# Patient Record
Sex: Female | Born: 1965 | Race: White | Hispanic: No | Marital: Married | Smoking: Former smoker
Health system: Southern US, Community
[De-identification: ages and names within clinical notes are randomized; demographics above are authoritative.]

## PROBLEM LIST (undated history)

## (undated) DIAGNOSIS — K219 Gastro-esophageal reflux disease without esophagitis: Secondary | ICD-10-CM

## (undated) DIAGNOSIS — D219 Benign neoplasm of connective and other soft tissue, unspecified: Secondary | ICD-10-CM

## (undated) DIAGNOSIS — F32A Depression, unspecified: Secondary | ICD-10-CM

## (undated) DIAGNOSIS — F329 Major depressive disorder, single episode, unspecified: Secondary | ICD-10-CM

## (undated) DIAGNOSIS — Z8619 Personal history of other infectious and parasitic diseases: Secondary | ICD-10-CM

## (undated) DIAGNOSIS — E569 Vitamin deficiency, unspecified: Secondary | ICD-10-CM

## (undated) DIAGNOSIS — N840 Polyp of corpus uteri: Secondary | ICD-10-CM

## (undated) DIAGNOSIS — D649 Anemia, unspecified: Secondary | ICD-10-CM

## (undated) DIAGNOSIS — G510 Bell's palsy: Secondary | ICD-10-CM

## (undated) HISTORY — DX: Polyp of corpus uteri: N84.0

## (undated) HISTORY — PX: ENDOMETRIAL BIOPSY: SHX622

## (undated) HISTORY — DX: Gastro-esophageal reflux disease without esophagitis: K21.9

## (undated) HISTORY — DX: Benign neoplasm of connective and other soft tissue, unspecified: D21.9

## (undated) HISTORY — DX: Vitamin deficiency, unspecified: E56.9

---

## 1988-02-14 HISTORY — PX: DILATION AND CURETTAGE OF UTERUS: SHX78

## 2004-06-29 ENCOUNTER — Ambulatory Visit: Payer: Self-pay | Admitting: Unknown Physician Specialty

## 2004-07-14 ENCOUNTER — Ambulatory Visit: Payer: Self-pay | Admitting: Unknown Physician Specialty

## 2006-04-30 ENCOUNTER — Ambulatory Visit: Payer: Self-pay | Admitting: Family Medicine

## 2006-09-10 IMAGING — US ULTRASOUND LEFT BREAST
1 series · 6 of 6 positions shown · non-contrast
Comparison: none

REASON FOR EXAM: lt breast mass      fibrocystic disease     time for yearly
COMMENTS:

PROCEDURE:     US  - US BREAST LEFT  - June 29, 2004 [DATE]
RESULT:       LEFT breast ultrasound was performed from 6 o'clock to 7
o'clock in the region of palpable abnormality.   No abnormalities were
identified by ultrasound.  This does not preclude biopsy if a palpable
nodule is present.

[Series 1: ultrasound left breast · 6 of 6 slices shown]
[im 1/6]
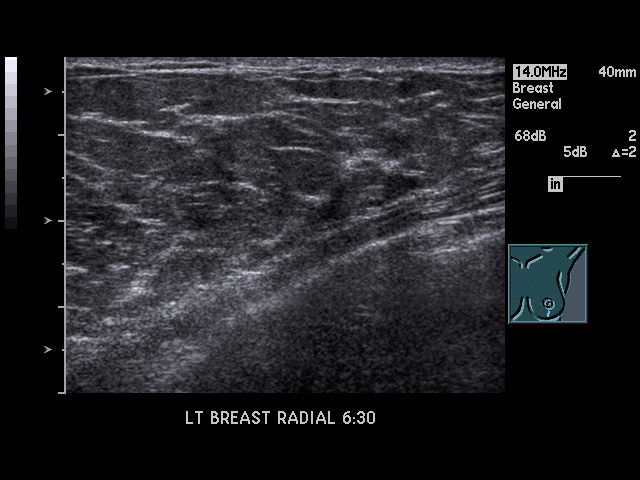
[im 2/6]
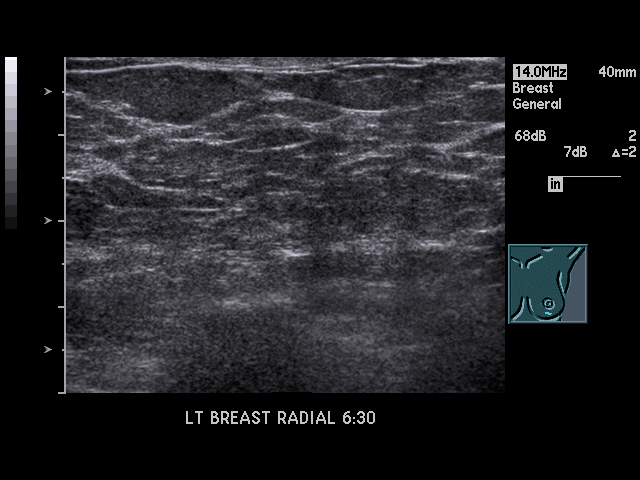
[im 3/6]
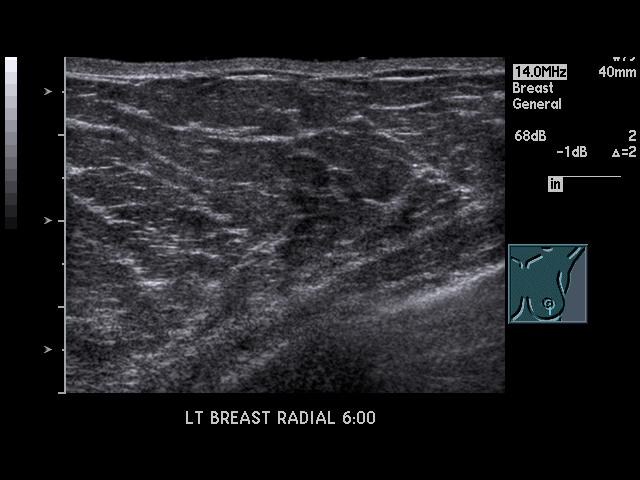
[im 4/6]
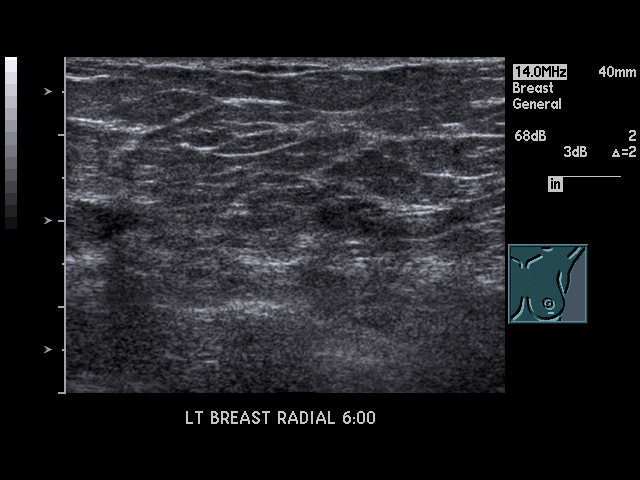
[im 5/6]
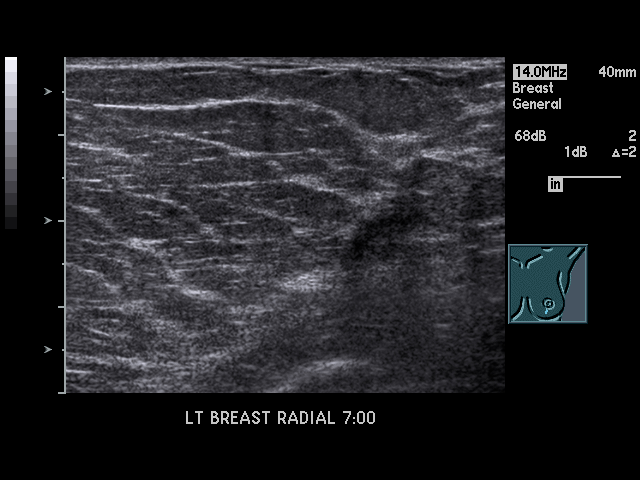
[im 6/6]
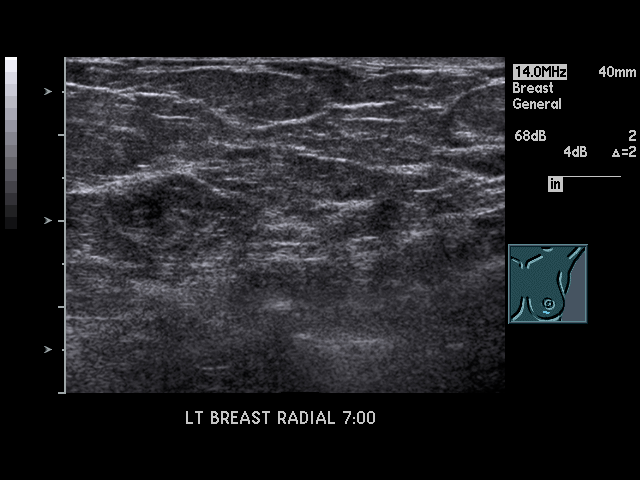

[6 of 6 positions shown; findings below may reference images not displayed]

IMPRESSION: Ultrasound revealed no abnormalities.  This does not
preclude biopsy if a palpable lesion is present.

## 2007-10-23 ENCOUNTER — Ambulatory Visit: Payer: Self-pay | Admitting: Family Medicine

## 2008-09-21 ENCOUNTER — Ambulatory Visit: Payer: Self-pay | Admitting: Internal Medicine

## 2009-04-07 ENCOUNTER — Ambulatory Visit: Payer: Self-pay

## 2009-04-30 ENCOUNTER — Ambulatory Visit: Payer: Self-pay | Admitting: Unknown Physician Specialty

## 2011-11-07 LAB — HM MAMMOGRAPHY: HM Mammogram: NORMAL (ref 0–4)

## 2014-05-09 LAB — HM PAP SMEAR: HM Pap smear: NEGATIVE

## 2014-12-24 ENCOUNTER — Encounter: Payer: Self-pay | Admitting: *Deleted

## 2014-12-25 ENCOUNTER — Encounter: Admission: RE | Payer: Self-pay | Source: Ambulatory Visit

## 2014-12-25 ENCOUNTER — Ambulatory Visit: Admission: RE | Admit: 2014-12-25 | Payer: Self-pay | Source: Ambulatory Visit | Admitting: Gastroenterology

## 2014-12-25 HISTORY — DX: Depression, unspecified: F32.A

## 2014-12-25 HISTORY — DX: Major depressive disorder, single episode, unspecified: F32.9

## 2014-12-25 HISTORY — DX: Personal history of other infectious and parasitic diseases: Z86.19

## 2014-12-25 HISTORY — DX: Anemia, unspecified: D64.9

## 2014-12-25 HISTORY — DX: Bell's palsy: G51.0

## 2014-12-25 SURGERY — COLONOSCOPY WITH PROPOFOL
Anesthesia: General

## 2015-01-13 ENCOUNTER — Encounter: Payer: Self-pay | Admitting: *Deleted

## 2015-01-14 ENCOUNTER — Ambulatory Visit: Payer: 59 | Admitting: Anesthesiology

## 2015-01-14 ENCOUNTER — Encounter
Admission: RE | Disposition: A | Discharge status: Home / Self Care | Payer: Self-pay | Source: Ambulatory Visit | Attending: Gastroenterology

## 2015-01-14 ENCOUNTER — Ambulatory Visit
Admission: RE | Admit: 2015-01-14 | Discharge: 2015-01-14 | Disposition: A | Discharge status: Home / Self Care | Payer: 59 | Source: Ambulatory Visit | Attending: Gastroenterology | Admitting: Gastroenterology

## 2015-01-14 DIAGNOSIS — Z79899 Other long term (current) drug therapy: Secondary | ICD-10-CM | POA: Diagnosis not present

## 2015-01-14 DIAGNOSIS — Z880 Allergy status to penicillin: Secondary | ICD-10-CM | POA: Diagnosis not present

## 2015-01-14 DIAGNOSIS — Z9109 Other allergy status, other than to drugs and biological substances: Secondary | ICD-10-CM | POA: Insufficient documentation

## 2015-01-14 DIAGNOSIS — Z1211 Encounter for screening for malignant neoplasm of colon: Secondary | ICD-10-CM | POA: Insufficient documentation

## 2015-01-14 DIAGNOSIS — D128 Benign neoplasm of rectum: Secondary | ICD-10-CM | POA: Diagnosis not present

## 2015-01-14 DIAGNOSIS — Z881 Allergy status to other antibiotic agents status: Secondary | ICD-10-CM | POA: Diagnosis not present

## 2015-01-14 DIAGNOSIS — F329 Major depressive disorder, single episode, unspecified: Secondary | ICD-10-CM | POA: Diagnosis not present

## 2015-01-14 HISTORY — PX: COLONOSCOPY WITH PROPOFOL: SHX5780

## 2015-01-14 HISTORY — PX: COLPOSCOPY: SHX161

## 2015-01-14 SURGERY — COLONOSCOPY WITH PROPOFOL
Anesthesia: General

## 2015-01-14 MED ORDER — SODIUM CHLORIDE 0.9 % IV SOLN
INTRAVENOUS | Status: DC
  Administered 2015-01-14: 10:00:00 via INTRAVENOUS

## 2015-01-14 MED ORDER — EPHEDRINE SULFATE 50 MG/ML IJ SOLN
INTRAMUSCULAR | Status: DC | PRN
  Administered 2015-01-14: 5 mg via INTRAVENOUS

## 2015-01-14 MED ORDER — PROPOFOL 500 MG/50ML IV EMUL
INTRAVENOUS | Status: DC | PRN
  Administered 2015-01-14: 120 ug/kg/min via INTRAVENOUS

## 2015-01-14 MED ORDER — MIDAZOLAM HCL 2 MG/2ML IJ SOLN
INTRAMUSCULAR | Status: DC | PRN
  Administered 2015-01-14: 1 mg via INTRAVENOUS

## 2015-01-14 MED ORDER — FENTANYL CITRATE (PF) 100 MCG/2ML IJ SOLN
INTRAMUSCULAR | Status: DC | PRN
  Administered 2015-01-14: 50 ug via INTRAVENOUS

## 2015-01-14 NOTE — Transfer of Care (Signed)
Immediate Anesthesia Transfer of Care Note  Patient: Sheila Powers  Procedure(s) Performed: Procedure(s): COLONOSCOPY WITH PROPOFOL (N/A)  Patient Location: PACU  Anesthesia Type:General  Level of Consciousness: awake, alert  and sedated  Airway & Oxygen Therapy: Patient Spontanous Breathing and Patient connected to nasal cannula oxygen  Post-op Assessment: Report given to RN and Post -op Vital signs reviewed and stable  Post vital signs: Reviewed and stable  Last Vitals:  Filed Vitals:   01/14/15 1002  BP: 126/76  Pulse: 72  Temp: 36.8 C  Resp: 17    Complications: No apparent anesthesia complications

## 2015-01-14 NOTE — H&P (Signed)
    Primary Care Physician:  Maryland Pink, MD Primary Gastroenterologist:  Dr. Candace Cruise  Pre-Procedure History & Physical: HPI:  Sheila Powers is a 49 y.o. female is here for an colonoscopy.  Past Medical History  Diagnosis Date  . Anemia   . Depression   . History of chicken pox   . Bell's palsy     Past Surgical History  Procedure Laterality Date  . Dilation and curettage of uterus  1990  . Breast biopsy  1987  1996    Prior to Admission medications   Medication Sig Start Date End Date Taking? Authorizing Provider  ergocalciferol (VITAMIN D2) 50000 UNITS capsule Take 50,000 Units by mouth once a week.    Historical Provider, MD  escitalopram (LEXAPRO) 20 MG tablet Take 20 mg by mouth daily.    Historical Provider, MD  omeprazole (PRILOSEC) 20 MG capsule Take 20 mg by mouth daily.    Historical Provider, MD    Allergies as of 01/01/2015 - Review Complete 12/24/2014  Allergen Reaction Noted  . Erythromycin  12/24/2014  . Penicillins  12/24/2014  . Wellbutrin [bupropion]  12/24/2014    History reviewed. No pertinent family history.  Social History   Social History  . Marital Status: Married    Spouse Name: N/A  . Number of Children: N/A  . Years of Education: N/A   Occupational History  . Not on file.   Social History Main Topics  . Smoking status: Former Smoker    Types: Cigarettes  . Smokeless tobacco: Former Systems developer  . Alcohol Use: No  . Drug Use: No  . Sexual Activity: Not on file   Other Topics Concern  . Not on file   Social History Narrative    Review of Systems: See HPI, otherwise negative ROS  Physical Exam: There were no vitals taken for this visit. General:   Alert,  pleasant and cooperative in NAD Head:  Normocephalic and atraumatic. Neck:  Supple; no masses or thyromegaly. Lungs:  Clear throughout to auscultation.    Heart:  Regular rate and rhythm. Abdomen:  Soft, nontender and nondistended. Normal bowel sounds, without guarding, and  without rebound.   Neurologic:  Alert and  oriented x4;  grossly normal neurologically.  Impression/Plan: AMIE TONKINSON is here for an colonoscopy to be performed for screening.  Risks, benefits, limitations, and alternatives regarding  colonoscopy have been reviewed with the patient.  Questions have been answered.  All parties agreeable.   Caillou Minus, Lupita Dawn, MD  01/14/2015, 9:46 AM

## 2015-01-14 NOTE — Anesthesia Procedure Notes (Signed)
Performed by: COOK-MARTIN, Nikyah Lackman Pre-anesthesia Checklist: Patient identified, Emergency Drugs available, Suction available, Patient being monitored and Timeout performed Patient Re-evaluated:Patient Re-evaluated prior to inductionOxygen Delivery Method: Nasal cannula Preoxygenation: Pre-oxygenation with 100% oxygen Intubation Type: IV induction Placement Confirmation: positive ETCO2 and CO2 detector       

## 2015-01-14 NOTE — Anesthesia Postprocedure Evaluation (Signed)
Anesthesia Post Note  Patient: Sheila Powers  Procedure(s) Performed: Procedure(s) (LRB): COLONOSCOPY WITH PROPOFOL (N/A)  Patient location during evaluation: PACU Anesthesia Type: General Level of consciousness: awake Pain management: pain level controlled Vital Signs Assessment: post-procedure vital signs reviewed and stable Respiratory status: spontaneous breathing Cardiovascular status: stable Anesthetic complications: no    Last Vitals:  Filed Vitals:   01/14/15 1140 01/14/15 1143  BP: 119/105 110/83  Pulse: 73   Temp:    Resp: 24     Last Pain: There were no vitals filed for this visit.               VAN STAVEREN,Perri Aragones

## 2015-01-14 NOTE — Anesthesia Preprocedure Evaluation (Signed)
Anesthesia Evaluation  Patient identified by MRN, date of birth, ID band Patient awake    Reviewed: Allergy & Precautions, NPO status , Patient's Chart, lab work & pertinent test results  History of Anesthesia Complications Negative for: history of anesthetic complications  Airway Mallampati: II       Dental no notable dental hx. (+) Teeth Intact   Pulmonary neg pulmonary ROS, former smoker,    breath sounds clear to auscultation       Cardiovascular negative cardio ROS   Rhythm:Regular     Neuro/Psych    GI/Hepatic negative GI ROS, Neg liver ROS,   Endo/Other  negative endocrine ROS  Renal/GU negative Renal ROS     Musculoskeletal negative musculoskeletal ROS (+)   Abdominal Normal abdominal exam  (+)   Peds  Hematology negative hematology ROS (+)   Anesthesia Other Findings   Reproductive/Obstetrics                             Anesthesia Physical Anesthesia Plan  ASA: II  Anesthesia Plan: General   Post-op Pain Management:    Induction: Intravenous  Airway Management Planned: Nasal Cannula  Additional Equipment:   Intra-op Plan:   Post-operative Plan:   Informed Consent: I have reviewed the patients History and Physical, chart, labs and discussed the procedure including the risks, benefits and alternatives for the proposed anesthesia with the patient or authorized representative who has indicated his/her understanding and acceptance.     Plan Discussed with: CRNA  Anesthesia Plan Comments:         Anesthesia Quick Evaluation

## 2015-01-14 NOTE — Op Note (Signed)
[facility] Gastroenterology Patient Name: Sheila Powers Procedure Date: 01/14/2015 10:46 AM MRN: [id number] Account #: [id number] Date of Birth: [birth year] Admit Type: Outpatient Age: 49 Room: [facility] ENDO ROOM 3 Gender: Female Note Status: Finalized Procedure:         Colonoscopy Indications:       Screening for colorectal malignant neoplasm Providers:         Lupita Dawn. Candace Cruise, MD Referring MD:      Irven Easterly. Kary Kos, MD (Referring MD) Medicines:         Monitored Anesthesia Care Complications:     No immediate complications. Procedure:         Pre-Anesthesia Assessment:                    - Prior to the procedure, a History and Physical was                     performed, and patient medications, allergies and                     sensitivities were reviewed. The patient's tolerance of                     previous anesthesia was reviewed.                    - The risks and benefits of the procedure and the sedation                     options and risks were discussed with the patient. All                     questions were answered and informed consent was obtained.                    - After reviewing the risks and benefits, the patient was                     deemed in satisfactory condition to undergo the procedure.                    After obtaining informed consent, the colonoscope was                     passed under direct vision. Throughout the procedure, the                     patient's blood pressure, pulse, and oxygen saturations                     were monitored continuously. The Olympus PCF-H180AL                     colonoscope ( S#: [id number] ) was introduced through the                     anus and advanced to the the cecum, identified by                     appendiceal orifice and ileocecal valve. The colonoscopy                     was performed without difficulty. The patient tolerated  the procedure well. The quality of the bowel  preparation                     was good. Findings:      A small polyp was found in the rectum. The polyp was sessile. The polyp       was removed with a cold snare. Resection and retrieval were complete.      The exam was otherwise without abnormality. Impression:        - One small polyp in the rectum. Resected and retrieved.                    - The examination was otherwise normal. Recommendation:    - Discharge patient to home.                    - Await pathology results.                    - The findings and recommendations were discussed with the                     patient.                    - Repeat colonoscopy in 5 years for surveillance based on                     pathology results.                    - The findings and recommendations were discussed with the                     patient. Procedure Code(s): --- Professional ---                    [phone], Colonoscopy, flexible; with removal of tumor(s),                     polyp(s), or other lesion(s) by snare technique Diagnosis Code(s): --- Professional ---                    Z12.11, Encounter for screening for malignant neoplasm of                     colon                    K62.1, Rectal polyp CPT copyright 2014 American Medical Association. All rights reserved. The codes documented in this report are preliminary and upon coder review may  be revised to meet current compliance requirements. Hulen Luster, MD 01/14/2015 11:06:56 AM This report has been signed electronically. Number of Addenda: 0 Note Initiated On: 01/14/2015 10:46 AM Scope Withdrawal Time: 0 hours 4 minutes 44 seconds  Total Procedure Duration: 0 hours 11 minutes 54 seconds       [facility]

## 2015-01-15 LAB — SURGICAL PATHOLOGY

## 2015-01-18 ENCOUNTER — Encounter: Payer: Self-pay | Admitting: Gastroenterology

## 2016-02-23 ENCOUNTER — Other Ambulatory Visit: Payer: Self-pay | Admitting: Family Medicine

## 2016-02-23 DIAGNOSIS — Z1231 Encounter for screening mammogram for malignant neoplasm of breast: Secondary | ICD-10-CM

## 2016-03-24 ENCOUNTER — Ambulatory Visit
Admission: RE | Admit: 2016-03-24 | Discharge: 2016-03-24 | Disposition: A | Discharge status: Home / Self Care | Payer: BLUE CROSS/BLUE SHIELD | Source: Ambulatory Visit | Attending: Family Medicine | Admitting: Family Medicine

## 2016-03-24 ENCOUNTER — Encounter (HOSPITAL_COMMUNITY): Payer: Self-pay

## 2016-03-24 DIAGNOSIS — Z1231 Encounter for screening mammogram for malignant neoplasm of breast: Secondary | ICD-10-CM | POA: Diagnosis not present

## 2016-05-23 ENCOUNTER — Ambulatory Visit (INDEPENDENT_AMBULATORY_CARE_PROVIDER_SITE_OTHER): Payer: BLUE CROSS/BLUE SHIELD | Admitting: Obstetrics and Gynecology

## 2016-05-23 ENCOUNTER — Encounter: Payer: Self-pay | Admitting: Obstetrics and Gynecology

## 2016-05-23 VITALS — BP 136/88 | HR 86 | Ht 63.0 in | Wt 169.0 lb

## 2016-05-23 DIAGNOSIS — Z780 Asymptomatic menopausal state: Secondary | ICD-10-CM | POA: Diagnosis not present

## 2016-05-23 DIAGNOSIS — Z01419 Encounter for gynecological examination (general) (routine) without abnormal findings: Secondary | ICD-10-CM | POA: Diagnosis not present

## 2016-05-23 NOTE — Progress Notes (Signed)
Chief Complaint  Patient presents with  . Gynecologic Exam    spotting one time few weeks ago    HPI:      [name] is a 51 y.o. No obstetric history on file. who LMP was No LMP recorded. Patient is postmenopausal., presents today for her annual examination.  Her menses are absent. She does not have intermenstrual bleeding. She has a hx of endometrial polyp rem years ago with Dr. Rayford Halsted. She noticed a scant amt of red bleeding with wiping  Recently after straining for a BM. No blood on her underwear.  She does not have vasomotor sx.   Sex activity: not sexually active. She does not have vaginal dryness.  Last Pap: May 05, 2014  Results were: no abnormalities /neg HPV DNA.  Hx of STDs: none  Last mammogram: March 24, 2016 with PCP. Results were: normal--routine follow-up in 12 months There is a FH of breast cancer in her pat aunt. Genetic testing not indicated. There is no FH of ovarian cancer. The patient does do self-breast exams.  Colonoscopy: 2016 with polyp; due for repeat in 3 yrs   Tobacco use: The patient denies current or previous tobacco use. Alcohol use: none Exercise: not active  She does get adequate calcium and Vitamin D in her diet.   Past Medical History:  Diagnosis Date  . Anemia   . Bell's palsy   . Depression   . Fibroids   . GERD (gastroesophageal reflux disease)   . History of chicken pox   . Uterine polyp   . Vitamin deficiency     Past Surgical History:  Procedure Laterality Date  . BREAST BIOPSY Left 1987  1996   neg  . COLONOSCOPY WITH PROPOFOL N/A 01/14/2015   Procedure: COLONOSCOPY WITH PROPOFOL;  Surgeon: Hulen Luster, MD;  Location: [facility] ENDOSCOPY;  Service: Gastroenterology;  Laterality: N/A;  . COLPOSCOPY  01/2015   Polyd removed  . DILATION AND CURETTAGE OF UTERUS  1990  . ENDOMETRIAL BIOPSY  10/2008   [facility]    Family History  Problem Relation Age of Onset  . Breast cancer Paternal Aunt 35    come back currently in  62's  . Skin cancer Cousin    Social History   Social History  . Marital status: Married    Spouse name: N/A  . Number of children: N/A  . Years of education: N/A   Occupational History  . Not on file.   Social History Main Topics  . Smoking status: Former Smoker    Types: Cigarettes  . Smokeless tobacco: Former Systems developer  . Alcohol use No  . Drug use: No  . Sexual activity: Not Currently    Birth control/ protection: Post-menopausal   Other Topics Concern  . Not on file   Social History Narrative  . No narrative on file    Current Outpatient Prescriptions:  .  ergocalciferol (VITAMIN D2) 50000 UNITS capsule, Take 50,000 Units by mouth once a week., Disp: , Rfl:  .  escitalopram (LEXAPRO) 20 MG tablet, Take 20 mg by mouth daily., Disp: , Rfl:    ROS:  Review of Systems  Constitutional: Negative for fever, malaise/fatigue and weight loss.  HENT: Negative for congestion, ear pain and sinus pain.   Respiratory: Negative for cough, shortness of breath and wheezing.   Cardiovascular: Negative for chest pain, orthopnea and leg swelling.  Gastrointestinal: Negative for constipation, diarrhea, nausea and vomiting.  Genitourinary: Negative for dysuria, frequency, hematuria  and urgency.       Breast ROS: negative   Musculoskeletal: Negative for back pain, joint pain and myalgias.  Skin: Negative for itching and rash.  Neurological: Negative for dizziness, tingling, focal weakness and headaches.  Endo/Heme/Allergies: Negative for environmental allergies. Does not bruise/bleed easily.  Psychiatric/Behavioral: Positive for depression. Negative for suicidal ideas. The patient is nervous/anxious. The patient does not have insomnia.     Objective: BP 136/88 (BP Location: Left Arm, Patient Position: Sitting, Cuff Size: Normal)   Pulse 86   Ht 5\' 3"  (1.6 m)   Wt 169 lb (76.7 kg)   BMI 29.94 kg/m    Physical Exam  Constitutional: She is oriented to person, place, and time. She  appears well-developed and well-nourished.  Genitourinary: Vagina normal and uterus normal. There is no rash or lesion on the right labia. There is no rash or lesion on the left labia. No erythema or tenderness in the vagina. No vaginal discharge found. Right adnexum does not display mass and does not display tenderness. Left adnexum does not display mass and does not display tenderness. Cervix does not exhibit motion tenderness or polyp. Uterus is not enlarged or tender.  Neck: Normal range of motion. No thyromegaly present.  Cardiovascular: Normal rate, regular rhythm and normal heart sounds.   No murmur heard. Pulmonary/Chest: Effort normal and breath sounds normal. Right breast exhibits no mass, no nipple discharge, no skin change and no tenderness. Left breast exhibits no mass, no nipple discharge, no skin change and no tenderness.  Abdominal: Soft. There is no tenderness. There is no guarding.  Musculoskeletal: Normal range of motion.  Neurological: She is alert and oriented to person, place, and time. No cranial nerve deficit.  Psychiatric: She has a normal mood and affect. Her behavior is normal.  Vitals reviewed.   Assessment/Plan: Encounter for annual routine gynecological examination  Postmenopause - F/u prn PMB.          GYN counsel menopause, adequate intake of calcium and vitamin D     F/U  Return in about 1 year (around 05/23/2017).  Evony Rezek B. Tyia Binford, PA-C 05/23/2016 2:08 PM

## 2017-06-27 ENCOUNTER — Other Ambulatory Visit: Payer: Self-pay | Admitting: Family Medicine

## 2017-06-27 DIAGNOSIS — Z1231 Encounter for screening mammogram for malignant neoplasm of breast: Secondary | ICD-10-CM

## 2017-07-17 ENCOUNTER — Ambulatory Visit
Admission: RE | Admit: 2017-07-17 | Discharge: 2017-07-17 | Disposition: A | Discharge status: Home / Self Care | Payer: BLUE CROSS/BLUE SHIELD | Source: Ambulatory Visit | Attending: Family Medicine | Admitting: Family Medicine

## 2017-07-17 DIAGNOSIS — Z1231 Encounter for screening mammogram for malignant neoplasm of breast: Secondary | ICD-10-CM

## 2018-06-20 ENCOUNTER — Telehealth: Payer: Self-pay

## 2018-06-20 NOTE — Telephone Encounter (Signed)
Error

## 2019-07-08 ENCOUNTER — Ambulatory Visit: Payer: 59 | Admitting: Obstetrics and Gynecology

## 2019-07-08 NOTE — Progress Notes (Deleted)
No chief complaint on file.   HPI:      [name] is a 54 y.o. No obstetric history on file. who LMP was No LMP recorded. Patient is postmenopausal., presents today for her NP> 3 yrs annual examination.  Her menses are absent. She does not have intermenstrual bleeding. She has a hx of endometrial polyp rem years ago with Dr. Rayford Halsted. She noticed a scant amt of red bleeding with wiping  Recently after straining for a BM. No blood on her underwear.  She does not have vasomotor sx.   Sex activity: not sexually active. She does not have vaginal dryness.  Last Pap: May 05, 2014  Results were: no abnormalities /neg HPV DNA.  Hx of STDs: none  Last mammogram: 07/17/17 with PCP. Results were: normal--routine follow-up in 12 months There is a FH of breast cancer in her pat aunt. Genetic testing not indicated. There is no FH of ovarian cancer. The patient does do self-breast exams.  Colonoscopy: 2019  due for repeat in 3***yrs   Tobacco use: The patient denies current or previous tobacco use. Alcohol use: none  No drug use Exercise: not active  She does get adequate calcium and Vitamin D in her diet. Labs with PCP  Past Medical History:  Diagnosis Date  . Anemia   . Bell's palsy   . Depression   . Fibroids   . GERD (gastroesophageal reflux disease)   . History of chicken pox   . Uterine polyp   . Vitamin deficiency     Past Surgical History:  Procedure Laterality Date  . BREAST BIOPSY Left 1987  1996   neg  . COLONOSCOPY WITH PROPOFOL N/A 01/14/2015   Procedure: COLONOSCOPY WITH PROPOFOL;  Surgeon: Hulen Luster, MD;  Location: [facility] ENDOSCOPY;  Service: Gastroenterology;  Laterality: N/A;  . COLPOSCOPY  01/2015   Polyd removed  . DILATION AND CURETTAGE OF UTERUS  1990  . ENDOMETRIAL BIOPSY  10/2008   [facility]    Family History  Problem Relation Age of Onset  . Breast cancer Paternal Aunt 74       come back currently in 73's  . Skin cancer Cousin    Social History    Socioeconomic History  . Marital status: Married    Spouse name: Not on file  . Number of children: Not on file  . Years of education: Not on file  . Highest education level: Not on file  Occupational History  . Not on file  Tobacco Use  . Smoking status: Former Smoker    Types: Cigarettes  . Smokeless tobacco: Former Network engineer and Sexual Activity  . Alcohol use: No  . Drug use: No  . Sexual activity: Not Currently    Birth control/protection: Post-menopausal  Other Topics Concern  . Not on file  Social History Narrative  . Not on file   Social Determinants of Health   Financial Resource Strain:   . Difficulty of Paying Living Expenses:   Food Insecurity:   . Worried About Charity fundraiser in the Last Year:   . Arboriculturist in the Last Year:   Transportation Needs:   . Film/video editor (Medical):   Marland Kitchen Lack of Transportation (Non-Medical):   Physical Activity:   . Days of Exercise per Week:   . Minutes of Exercise per Session:   Stress:   . Feeling of Stress :   Social Connections:   . Frequency of  Communication with Friends and Family:   . Frequency of Social Gatherings with Friends and Family:   . Attends Religious Services:   . Active Member of Clubs or Organizations:   . Attends Archivist Meetings:   Marland Kitchen Marital Status:   Intimate Partner Violence:   . Fear of Current or Ex-Partner:   . Emotionally Abused:   Marland Kitchen Physically Abused:   . Sexually Abused:     Current Outpatient Medications:  .  ergocalciferol (VITAMIN D2) 50000 UNITS capsule, Take 50,000 Units by mouth once a week., Disp: , Rfl:  .  escitalopram (LEXAPRO) 20 MG tablet, Take 20 mg by mouth daily., Disp: , Rfl:    ROS:  Review of Systems  Constitutional: Negative for fever, malaise/fatigue and weight loss.  HENT: Negative for congestion, ear pain and sinus pain.   Respiratory: Negative for cough, shortness of breath and wheezing.   Cardiovascular: Negative for  chest pain, orthopnea and leg swelling.  Gastrointestinal: Negative for constipation, diarrhea, nausea and vomiting.  Genitourinary: Negative for dysuria, frequency, hematuria and urgency.       Breast ROS: negative   Musculoskeletal: Negative for back pain, joint pain and myalgias.  Skin: Negative for itching and rash.  Neurological: Negative for dizziness, tingling, focal weakness and headaches.  Endo/Heme/Allergies: Negative for environmental allergies. Does not bruise/bleed easily.  Psychiatric/Behavioral: Positive for depression. Negative for suicidal ideas. The patient is nervous/anxious. The patient does not have insomnia.     Objective: There were no vitals taken for this visit.   Physical Exam Constitutional:      Appearance: She is well-developed.  Genitourinary:     Vagina and uterus normal.     No vaginal discharge, erythema or tenderness.     No cervical motion tenderness or polyp.     Uterus is not enlarged or tender.     No right or left adnexal mass present.     Right adnexa not tender.     Left adnexa not tender.  Neck:     Thyroid: No thyromegaly.  Cardiovascular:     Rate and Rhythm: Normal rate and regular rhythm.     Heart sounds: Normal heart sounds. No murmur.  Pulmonary:     Effort: Pulmonary effort is normal.     Breath sounds: Normal breath sounds.  Chest:     Breasts:        Right: No mass, nipple discharge, skin change or tenderness.        Left: No mass, nipple discharge, skin change or tenderness.  Abdominal:     Palpations: Abdomen is soft.     Tenderness: There is no abdominal tenderness. There is no guarding.  Musculoskeletal:        General: Normal range of motion.     Cervical back: Normal range of motion.  Neurological:     Mental Status: She is alert and oriented to person, place, and time.     Cranial Nerves: No cranial nerve deficit.  Psychiatric:        Behavior: Behavior normal.  Vitals reviewed.     Assessment/Plan: No  diagnosis found.         GYN counsel menopause, adequate intake of calcium and vitamin D     F/U  No follow-ups on file.  Tonae Livolsi B. Ahmere Hemenway, PA-C 07/08/2019 10:32 AM

## 2019-07-09 NOTE — Progress Notes (Signed)
Chief Complaint  Patient presents with  . Gynecologic Exam    HPI:      [name] is a 54 y.o. No obstetric history on file. who LMP was No LMP recorded. Patient is postmenopausal., presents today for her annual examination.  Her menses are absent due to menopause. She does not have postmenopausal bleeding. She has a hx of endometrial polyp rem years ago with Dr. Rayford Halsted. She does not have vasomotor sx.   Sex activity: currently sexually active. She has vaginal dryness and uses lubricants with relief.  Last Pap: May 05, 2014  Results were: no abnormalities /neg HPV DNA.  Hx of STDs: none  Last mammogram: 07/17/17 with PCP. Results were: normal--routine follow-up in 12 months There is a FH of breast cancer in her pat aunt. Genetic testing not indicated. There is no FH of ovarian cancer. The patient does do self-breast exams.  Colonoscopy: 2019, hx of polyps; due for repeat in 5 yrs. Having hemorrhoid issues.  Tobacco use: The patient denies current or previous tobacco use. Alcohol use: none  No drug use Exercise: mod active  She does get adequate calcium and Vitamin D in her diet.  Labs with PCP. Has hx of borderline LDL in 160s. Pt not interested in statin. No FH early CAD/hyperlipidemia. Pt trying diet changes. PCP suggested she discuss with me, too.   Past Medical History:  Diagnosis Date  . Anemia   . Bell's palsy   . Depression   . Fibroids   . GERD (gastroesophageal reflux disease)   . History of chicken pox   . Uterine polyp   . Vitamin deficiency     Past Surgical History:  Procedure Laterality Date  . BREAST BIOPSY Left 1987  1996   neg  . COLONOSCOPY WITH PROPOFOL N/A 01/14/2015   Procedure: COLONOSCOPY WITH PROPOFOL;  Surgeon: Hulen Luster, MD;  Location: [facility] ENDOSCOPY;  Service: Gastroenterology;  Laterality: N/A;  . COLPOSCOPY  01/2015   Polyd removed  . DILATION AND CURETTAGE OF UTERUS  1990  . ENDOMETRIAL BIOPSY  10/2008   CAK    Family  History  Problem Relation Age of Onset  . Breast cancer Paternal Aunt 70       come back currently in 44's  . Skin cancer Cousin    Social History   Socioeconomic History  . Marital status: Married    Spouse name: Not on file  . Number of children: Not on file  . Years of education: Not on file  . Highest education level: Not on file  Occupational History  . Not on file  Tobacco Use  . Smoking status: Former Smoker    Types: Cigarettes  . Smokeless tobacco: Former Network engineer and Sexual Activity  . Alcohol use: No  . Drug use: No  . Sexual activity: Yes    Birth control/protection: Post-menopausal  Other Topics Concern  . Not on file  Social History Narrative  . Not on file   Social Determinants of Health   Financial Resource Strain:   . Difficulty of Paying Living Expenses:   Food Insecurity:   . Worried About Charity fundraiser in the Last Year:   . Arboriculturist in the Last Year:   Transportation Needs:   . Film/video editor (Medical):   Marland Kitchen Lack of Transportation (Non-Medical):   Physical Activity:   . Days of Exercise per Week:   . Minutes of Exercise per Session:  Stress:   . Feeling of Stress :   Social Connections:   . Frequency of Communication with Friends and Family:   . Frequency of Social Gatherings with Friends and Family:   . Attends Religious Services:   . Active Member of Clubs or Organizations:   . Attends Archivist Meetings:   Marland Kitchen Marital Status:   Intimate Partner Violence:   . Fear of Current or Ex-Partner:   . Emotionally Abused:   Marland Kitchen Physically Abused:   . Sexually Abused:     Current Outpatient Medications:  .  ergocalciferol (VITAMIN D2) 50000 UNITS capsule, Take 50,000 Units by mouth once a week., Disp: , Rfl:  .  escitalopram (LEXAPRO) 20 MG tablet, Take 20 mg by mouth daily., Disp: , Rfl:    ROS:  Review of Systems  Constitutional: Negative for fever, malaise/fatigue and weight loss.  HENT: Negative for  congestion, ear pain and sinus pain.   Respiratory: Negative for cough, shortness of breath and wheezing.   Cardiovascular: Negative for chest pain, orthopnea and leg swelling.  Gastrointestinal: Negative for constipation, diarrhea, nausea and vomiting.  Genitourinary: Negative for dysuria, frequency, hematuria and urgency.       Breast ROS: negative   Musculoskeletal: Negative for back pain, joint pain and myalgias.  Skin: Negative for itching and rash.  Neurological: Negative for dizziness, tingling, focal weakness and headaches.  Endo/Heme/Allergies: Negative for environmental allergies. Does not bruise/bleed easily.  Psychiatric/Behavioral: Negative for depression and suicidal ideas. The patient is not nervous/anxious and does not have insomnia.     Objective: BP 118/80   Ht 5\' 3"  (1.6 m)   Wt 176 lb (79.8 kg)   BMI 31.18 kg/m    Physical Exam Constitutional:      Appearance: She is well-developed.  Genitourinary:     Vulva, vagina, uterus, right adnexa and left adnexa normal.     No vulval lesion or tenderness noted.     No vaginal discharge, erythema or tenderness.     No cervical motion tenderness or polyp.     Uterus is not enlarged or tender.     No right or left adnexal mass present.     Right adnexa not tender.     Left adnexa not tender.  Neck:     Thyroid: No thyromegaly.  Cardiovascular:     Rate and Rhythm: Normal rate and regular rhythm.     Heart sounds: Normal heart sounds. No murmur.  Pulmonary:     Effort: Pulmonary effort is normal.     Breath sounds: Normal breath sounds.  Chest:     Breasts:        Right: No mass, nipple discharge, skin change or tenderness.        Left: No mass, nipple discharge, skin change or tenderness.  Abdominal:     Palpations: Abdomen is soft.     Tenderness: There is no abdominal tenderness. There is no guarding.  Musculoskeletal:        General: Normal range of motion.     Cervical back: Normal range of motion.    Neurological:     General: No focal deficit present.     Mental Status: She is alert and oriented to person, place, and time.     Cranial Nerves: No cranial nerve deficit.  Skin:    General: Skin is warm and dry.  Psychiatric:        Mood and Affect: Mood normal.  Behavior: Behavior normal.        Thought Content: Thought content normal.        Judgment: Judgment normal.  Vitals reviewed.     Assessment/Plan: Encounter for annual routine gynecological examination  Cervical cancer screening - Plan: Cytology - PAP  Screening for HPV (human papillomavirus) - Plan: Cytology - PAP  Encounter for screening mammogram for malignant neoplasm of breast - Plan: MM 3D SCREEN BREAST BILATERAL; pt to sched mammo  Elevated LDL cholesterol level--add fish oil 2 g daily and red yeast rice extract supp 1200 mg daily to see if improves levels. Cont diet/exercise changes. Has lipid f/u with PCP in 6 months.          GYN counsel menopause, adequate intake of calcium and vitamin D     F/U  Return in about 1 year (around 07/09/2020).  Mosiah Bastin B. Dan Scearce, PA-C 07/11/2019 9:54 AM

## 2019-07-09 NOTE — Patient Instructions (Addendum)
I value your feedback and entrusting us with your care. If you get a [facility] patient survey, I would appreciate you taking the time to let us know about your experience today. Thank you! ° °As of January 23, 2019, your lab results will be released to your MyChart immediately, before I even have a chance to see them. Please give me time to review them and contact you if there are any abnormalities. Thank you for your patience.  ° °[facility]: [phone] ° ° ° °

## 2019-07-10 ENCOUNTER — Other Ambulatory Visit (HOSPITAL_COMMUNITY)
Admission: RE | Admit: 2019-07-10 | Discharge: 2019-07-10 | Disposition: A | Discharge status: Home / Self Care | Payer: 59 | Source: Ambulatory Visit | Attending: Obstetrics and Gynecology | Admitting: Obstetrics and Gynecology

## 2019-07-10 ENCOUNTER — Other Ambulatory Visit: Payer: Self-pay

## 2019-07-10 ENCOUNTER — Ambulatory Visit (INDEPENDENT_AMBULATORY_CARE_PROVIDER_SITE_OTHER): Payer: 59 | Admitting: Obstetrics and Gynecology

## 2019-07-10 ENCOUNTER — Encounter: Payer: Self-pay | Admitting: Obstetrics and Gynecology

## 2019-07-10 VITALS — BP 118/80 | Ht 63.0 in | Wt 176.0 lb

## 2019-07-10 DIAGNOSIS — E7800 Pure hypercholesterolemia, unspecified: Secondary | ICD-10-CM

## 2019-07-10 DIAGNOSIS — Z1151 Encounter for screening for human papillomavirus (HPV): Secondary | ICD-10-CM | POA: Diagnosis present

## 2019-07-10 DIAGNOSIS — Z1231 Encounter for screening mammogram for malignant neoplasm of breast: Secondary | ICD-10-CM

## 2019-07-10 DIAGNOSIS — Z01419 Encounter for gynecological examination (general) (routine) without abnormal findings: Secondary | ICD-10-CM

## 2019-07-10 DIAGNOSIS — Z124 Encounter for screening for malignant neoplasm of cervix: Secondary | ICD-10-CM | POA: Diagnosis not present

## 2019-07-11 DIAGNOSIS — E7800 Pure hypercholesterolemia, unspecified: Secondary | ICD-10-CM | POA: Insufficient documentation

## 2019-07-17 LAB — CYTOLOGY - PAP
Comment: NEGATIVE
Diagnosis: NEGATIVE
Diagnosis: REACTIVE
High risk HPV: NEGATIVE

## 2019-07-24 ENCOUNTER — Telehealth: Payer: Self-pay | Admitting: Cardiovascular Disease

## 2019-07-24 NOTE — Telephone Encounter (Signed)
error 

## 2020-08-10 ENCOUNTER — Other Ambulatory Visit: Payer: Self-pay | Admitting: Family Medicine

## 2020-08-10 DIAGNOSIS — Z1231 Encounter for screening mammogram for malignant neoplasm of breast: Secondary | ICD-10-CM

## 2021-08-31 ENCOUNTER — Emergency Department
Admission: EM | Admit: 2021-08-31 | Discharge: 2021-08-31 | Disposition: A | Discharge status: Home / Self Care | Payer: No Typology Code available for payment source | Attending: Emergency Medicine | Admitting: Emergency Medicine

## 2021-08-31 ENCOUNTER — Other Ambulatory Visit: Payer: Self-pay

## 2021-08-31 DIAGNOSIS — Z23 Encounter for immunization: Secondary | ICD-10-CM | POA: Diagnosis not present

## 2021-08-31 DIAGNOSIS — Z203 Contact with and (suspected) exposure to rabies: Secondary | ICD-10-CM | POA: Insufficient documentation

## 2021-08-31 DIAGNOSIS — Z2914 Encounter for prophylactic rabies immune globin: Secondary | ICD-10-CM | POA: Insufficient documentation

## 2021-08-31 DIAGNOSIS — S90872A Other superficial bite of left foot, initial encounter: Secondary | ICD-10-CM | POA: Insufficient documentation

## 2021-08-31 DIAGNOSIS — S99922A Unspecified injury of left foot, initial encounter: Secondary | ICD-10-CM | POA: Diagnosis present

## 2021-08-31 DIAGNOSIS — Y990 Civilian activity done for income or pay: Secondary | ICD-10-CM | POA: Diagnosis not present

## 2021-08-31 DIAGNOSIS — W540XXA Bitten by dog, initial encounter: Secondary | ICD-10-CM | POA: Insufficient documentation

## 2021-08-31 MED ORDER — RABIES IMMUNE GLOBULIN 150 UNIT/ML IM INJ
75.0000 [IU] | INJECTION | Freq: Once | INTRAMUSCULAR | Status: AC
Start: 2021-08-31 — End: 2021-08-31
  Administered 2021-08-31: 75 [IU]
  Filled 2021-08-31: qty 2

## 2021-08-31 MED ORDER — RABIES IMMUNE GLOBULIN 150 UNIT/ML IM INJ
1500.0000 [IU] | INJECTION | Freq: Once | INTRAMUSCULAR | Status: AC
  Administered 2021-08-31: 1500 [IU]
  Filled 2021-08-31: qty 10

## 2021-08-31 MED ORDER — RABIES VACCINE, PCEC IM SUSR
1.0000 mL | Freq: Once | INTRAMUSCULAR | Status: AC
  Administered 2021-08-31: 1 mL via INTRAMUSCULAR
  Filled 2021-08-31: qty 1

## 2021-08-31 MED ORDER — RABIES IMMUNE GLOBULIN 150 UNIT/ML IM INJ: 20.0000 [IU]/kg | INJECTION | Freq: Once | INTRAMUSCULAR | Status: DC

## 2021-08-31 NOTE — ED Triage Notes (Signed)
Pt here after a dog bite and here for her first rabies vaccine. Pt was seen at [facility] yesterday for the bite. Pt ambulatory to triage.

## 2021-08-31 NOTE — Discharge Instructions (Addendum)
You may go to [facility] urgent care for your continued rabies prophylaxis.  The dates that you  will need to be vaccinated are listed below.  7/22 7/26 8/2  Clean area on your left foot daily with mild soap and water and watch for any signs of infection.

## 2021-08-31 NOTE — ED Notes (Signed)
Pt is here for her rabies injection

## 2021-08-31 NOTE — ED Notes (Signed)
Contacted [facility] - chelsea nedwick. Per chelsea pt does not need UDS or other screening for workmans comp.

## 2021-08-31 NOTE — ED Provider Notes (Signed)
[facility] Provider Note    Event Date/Time   First MD Initiated Contact with Patient 08/31/21 1143     (approximate)   History   Rabies Injection   HPI  Sheila Powers is a 56 y.o. female   presents to the ED for rabies vaccine.  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  She is familiar with this family and has been told in the past that the family could not afford to have the dogs rabies vaccinated.  She was sent to [facility] urgent care in [place] as this was a Architectural technologist. case.  She asked about rabies and reporting this to the [facility] and was told that they could not do this now because it was 5:00 and they were closing.  Patient was given a tetanus booster and also a prescription for doxycycline.  She got a call from animal control this morning stating that they have gone by the house and no one would come to the door.  She was advised to get the rabies prophylaxis.  She denies any other difficulty at this time.      Physical Exam   Triage Vital Signs: ED Triage Vitals  Enc Vitals Group     BP 08/31/21 1130 132/89     Pulse Rate 08/31/21 1130 70     Resp 08/31/21 1130 18     Temp 08/31/21 1130 98.3 F (36.8 C)     Temp Source 08/31/21 1130 Oral     SpO2 08/31/21 1130 98 %     Weight 08/31/21 1137 175 lb 14.8 oz (79.8 kg)     Height 08/31/21 1137 '5\' 3"'$  (1.6 m)     Head Circumference --      Peak Flow --      Pain Score 08/31/21 1137 4     Pain Loc --      Pain Edu? --      Excl. in [place]? --     Most recent vital signs: Vitals:   08/31/21 1130  BP: 132/89  Pulse: 70  Resp: 18  Temp: 98.3 F (36.8 C)  SpO2: 98%     General: Awake, no distress.  CV:  Good peripheral perfusion.  Heart regular rate and rhythm Resp:  Normal effort.  Abd:  No distention.  Other:  Left lateral foot with superficial linear bite mark without evidence of  infection. No no foreign body and no active bleeding.   ED Results / Procedures / Treatments   Labs (all labs ordered are listed, but only abnormal results are displayed) Labs Reviewed - No data to display    PROCEDURES:  Critical Care performed:   Procedures   MEDICATIONS ORDERED IN ED: Medications  rabies immune globulin (HYPERRAB/KEDRAB) injection 1,500 Units (has no administration in time range)    And  rabies immune globulin (HYPERRAB/KEDRAB) injection 75 Units (has no administration in time range)  rabies vaccine (RABAVERT) injection 1 mL (1 mL Intramuscular Given 08/31/21 1232)     IMPRESSION / MDM / [place] / ED COURSE  I reviewed the triage vital signs and the nursing notes.   Differential diagnosis includes, but is not limited to, dog bite left foot, need for rabies prophylaxis, infected dog bite.  56 year old female was seen yesterday for a dog bite that was Workmen's Comp.  Patient was told that she would need to get  rabies prophylaxis however the urgent care that she went to told her that that she would need to go to the emergency department.  Tetanus was updated yesterday and patient was placed on antibiotic.  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  She has been told in the past that the new dogs are in this house have not been vaccinated due to financial reasons of the family.  Area appears to be healing without any signs of infection at this time.  Rabies immunization was started and patient is aware that she can go to the [facility] urgent care to get the remaining injections rather than come to the emergency department.  She is to continue to clean the area with mild soap and water and watch for any signs of infection.    Patient's presentation is most consistent with acute, uncomplicated illness.  FINAL CLINICAL IMPRESSION(S) / ED DIAGNOSES   Final diagnoses:  Dog bite of left foot, initial encounter  Need  for prophylactic vaccination and inoculation against rabies     Rx / DC Orders   ED Discharge Orders     None        Note:  This document was prepared using Dragon voice recognition software and may include unintentional dictation errors.   Johnn Hai, PA-C 08/31/21 1235    Blake Divine, MD 08/31/21 1843

## 2021-09-03 ENCOUNTER — Ambulatory Visit
Admission: EM | Admit: 2021-09-03 | Discharge: 2021-09-03 | Disposition: A | Discharge status: Home / Self Care | Payer: 59

## 2021-09-03 ENCOUNTER — Encounter: Payer: Self-pay | Admitting: Emergency Medicine

## 2021-09-03 DIAGNOSIS — Z203 Contact with and (suspected) exposure to rabies: Secondary | ICD-10-CM | POA: Diagnosis not present

## 2021-09-03 MED ORDER — RABIES VACCINE, PCEC IM SUSR
1.0000 mL | Freq: Once | INTRAMUSCULAR | Status: AC
  Administered 2021-09-03: 1 mL via INTRAMUSCULAR

## 2021-09-07 ENCOUNTER — Ambulatory Visit
Admission: EM | Admit: 2021-09-07 | Discharge: 2021-09-07 | Disposition: A | Discharge status: Home / Self Care | Payer: 59 | Attending: Family Medicine | Admitting: Family Medicine

## 2021-09-07 DIAGNOSIS — Z203 Contact with and (suspected) exposure to rabies: Secondary | ICD-10-CM

## 2021-09-07 DIAGNOSIS — Z23 Encounter for immunization: Secondary | ICD-10-CM | POA: Diagnosis not present

## 2021-09-07 MED ORDER — RABIES VACCINE, PCEC IM SUSR
1.0000 mL | Freq: Once | INTRAMUSCULAR | Status: AC
  Administered 2021-09-07: 1 mL via INTRAMUSCULAR

## 2021-09-07 NOTE — ED Triage Notes (Signed)
Pt presents for rabies injection. Denies any other complaints.

## 2021-09-14 ENCOUNTER — Ambulatory Visit
Admission: EM | Admit: 2021-09-14 | Discharge: 2021-09-14 | Disposition: A | Discharge status: Home / Self Care | Payer: 59 | Attending: Family Medicine | Admitting: Family Medicine

## 2021-09-14 ENCOUNTER — Encounter: Payer: Self-pay | Admitting: Emergency Medicine

## 2021-09-14 ENCOUNTER — Other Ambulatory Visit: Payer: Self-pay

## 2021-09-14 DIAGNOSIS — M79601 Pain in right arm: Secondary | ICD-10-CM | POA: Diagnosis not present

## 2021-09-14 DIAGNOSIS — Z203 Contact with and (suspected) exposure to rabies: Secondary | ICD-10-CM | POA: Diagnosis not present

## 2021-09-14 MED ORDER — CYCLOBENZAPRINE HCL 10 MG PO TABS: 10.0000 mg | ORAL_TABLET | Freq: Three times a day (TID) | ORAL | 0 refills | Status: DC | PRN

## 2021-09-14 MED ORDER — NAPROXEN 500 MG PO TABS: 500.0000 mg | ORAL_TABLET | Freq: Two times a day (BID) | ORAL | 0 refills | Status: DC | PRN

## 2021-09-14 MED ORDER — RABIES VACCINE, PCEC IM SUSR
1.0000 mL | Freq: Once | INTRAMUSCULAR | Status: AC
  Administered 2021-09-14: 1 mL via INTRAMUSCULAR

## 2021-09-14 NOTE — ED Triage Notes (Signed)
Pt presents for 4th rabies vaccine.   Pt also reports RUE pain that is worse with movement intermittently since 3rd rabies vaccine. Pt denies any known injury.

## 2021-09-14 NOTE — ED Provider Notes (Signed)
[facility]    CSN: [id number] Arrival date & time: 09/14/21  1831      History   Chief Complaint No chief complaint on file.   HPI Sheila Powers is a 56 y.o. female.   Patient presenting today with right shoulder pain in the deltoid region that started immediately upon getting her third rabies vaccine on the side.  She states the area has been stiff, sore since, seem to fade a bit the past few days but then since yesterday has been back worse than before.  Has lots of stiffness on range of motion, tenderness in that area.  Denies weakness, loss of range of motion, redness, swelling, numbness, tingling.  So far not trying anything over-the-counter for symptoms.    Past Medical History:  Diagnosis Date   Anemia    Bell's palsy    Depression    Fibroids    GERD (gastroesophageal reflux disease)    History of chicken pox    Uterine polyp    Vitamin deficiency     Patient Active Problem List   Diagnosis Date Noted   Elevated LDL cholesterol level 07/11/2019    Past Surgical History:  Procedure Laterality Date   BREAST BIOPSY Left 1987  1996   neg   COLONOSCOPY WITH PROPOFOL N/A 01/14/2015   Procedure: COLONOSCOPY WITH PROPOFOL;  Surgeon: Hulen Luster, MD;  Location: [facility] ENDOSCOPY;  Service: Gastroenterology;  Laterality: N/A;   COLPOSCOPY  01/2015   Polyd removed   DILATION AND CURETTAGE OF UTERUS  1990   ENDOMETRIAL BIOPSY  10/2008   [facility]K    OB History     Gravida  4   Para  3   Term      Preterm      AB  1   Living  3      SAB  1   IAB      Ectopic      Multiple      Live Births               Home Medications    Prior to Admission medications   Medication Sig Start Date End Date Taking? Authorizing Provider  cyclobenzaprine (FLEXERIL) 10 MG tablet Take 1 tablet (10 mg total) by mouth 3 (three) times daily as needed for muscle spasms. Do not drink alcohol or drive while taking this medication.  May cause drowsiness. 09/14/21   Yes Volney American, PA-C  naproxen (NAPROSYN) 500 MG tablet Take 1 tablet (500 mg total) by mouth 2 (two) times daily as needed. 09/14/21  Yes Volney American, PA-C  doxycycline (VIBRA-TABS) 100 MG tablet Take 100 mg by mouth 2 (two) times daily. 08/30/21   [provider]  ergocalciferol (VITAMIN D2) 50000 UNITS capsule Take 50,000 Units by mouth once a week.    [provider]  escitalopram (LEXAPRO) 20 MG tablet Take 20 mg by mouth daily.    [provider]    Family History Family History  Problem Relation Age of Onset   Breast cancer Paternal Aunt 9       come back currently in 63's   Skin cancer Cousin     Social History Social History   Tobacco Use   Smoking status: Former    Types: Cigarettes   Smokeless tobacco: Former  Scientific laboratory technician Use: Never used  Substance Use Topics   Alcohol use: No   Drug use: No  Allergies   Bupropion, Erythromycin, and Penicillins   Review of Systems Review of Systems Per HPI  Physical Exam Triage Vital Signs ED Triage Vitals  Enc Vitals Group     BP 09/14/21 1905 (!) 135/93     Pulse Rate 09/14/21 1905 72     Resp 09/14/21 1905 18     Temp 09/14/21 1905 97.6 F (36.4 C)     Temp Source 09/14/21 1905 Oral     SpO2 09/14/21 1905 98 %     Weight --      Height --      Head Circumference --      Peak Flow --      Pain Score 09/14/21 1906 4     Pain Loc --      Pain Edu? --      Excl. in [place]? --    No data found.  Updated Vital Signs BP (!) 135/93 (BP Location: Right Arm)   Pulse 72   Temp 97.6 F (36.4 C) (Oral)   Resp 18   SpO2 98%   Visual Acuity Right Eye Distance:   Left Eye Distance:   Bilateral Distance:    Right Eye Near:   Left Eye Near:    Bilateral Near:     Physical Exam Vitals and nursing note reviewed.  Constitutional:      Appearance: Normal appearance. She is not ill-appearing.  HENT:     Head: Atraumatic.     Mouth/Throat:     Mouth:  Mucous membranes are moist.  Eyes:     Extraocular Movements: Extraocular movements intact.     Conjunctiva/sclera: Conjunctivae normal.  Cardiovascular:     Rate and Rhythm: Normal rate and regular rhythm.     Heart sounds: Normal heart sounds.  Pulmonary:     Effort: Pulmonary effort is normal.     Breath sounds: Normal breath sounds.  Musculoskeletal:        General: Tenderness present. No swelling. Normal range of motion.     Cervical back: Normal range of motion and neck supple.     Comments: Point tenderness right deltoid in area of vaccine administration  Skin:    General: Skin is warm and dry.     Findings: No bruising or erythema.  Neurological:     Mental Status: She is alert and oriented to person, place, and time.     Motor: No weakness.     Gait: Gait normal.     Comments: Right upper extremity neurovascularly intact  Psychiatric:        Mood and Affect: Mood normal.        Thought Content: Thought content normal.        Judgment: Judgment normal.      UC Treatments / Results  Labs (all labs ordered are listed, but only abnormal results are displayed) Labs Reviewed - No data to display  EKG   Radiology No results found.  Procedures Procedures (including critical care time)  Medications Ordered in UC Medications  rabies vaccine (RABAVERT) injection 1 mL (1 mL Intramuscular Given 09/14/21 1903)    Initial Impression / Assessment and Plan / UC Course  I have reviewed the triage vital signs and the nursing notes.  Pertinent labs & imaging results that were available during my care of the patient were reviewed by me and considered in my medical decision making (see chart for details).     Exam reassuring today, suspect soreness related to recent vaccine.  Treat with Flexeril, naproxen, heat, massage, stretches.  Return for any worsening symptoms.  Final Clinical Impressions(s) / UC Diagnoses   Final diagnoses:  Right arm pain   Discharge  Instructions   None    ED Prescriptions     Medication Sig Dispense Auth. Provider   cyclobenzaprine (FLEXERIL) 10 MG tablet Take 1 tablet (10 mg total) by mouth 3 (three) times daily as needed for muscle spasms. Do not drink alcohol or drive while taking this medication.  May cause drowsiness. 15 tablet Volney American, [place]   naproxen (NAPROSYN) 500 MG tablet Take 1 tablet (500 mg total) by mouth 2 (two) times daily as needed. 30 tablet Volney American, [place]      PDMP not reviewed this encounter.   Volney American, [place] 09/14/21 1931

## 2021-12-08 ENCOUNTER — Ambulatory Visit: Payer: 59 | Admitting: Obstetrics and Gynecology

## 2021-12-08 NOTE — Progress Notes (Deleted)
    GYNECOLOGY PROGRESS NOTE  Subjective:    Patient ID: Sheila Powers, female    DOB: 22-Mar-1965, 56 y.o.   MRN: [id number]  HPI  Patient is a 56 y.o. G68P0013 female who presents for Postmenopausal bleeding   The following portions of the patient's history were reviewed and updated as appropriate: allergies, current medications, past family history, past medical history, past social history, past surgical history, and problem list.  Review of Systems Pertinent items are noted in HPI.   Objective:   There were no vitals taken for this visit. There is no height or weight on file to calculate BMI. General appearance: alert, cooperative, and no distress Abdomen: {abdominal exam:16834} Pelvic: {pelvic exam:16852::"cervix normal in appearance","external genitalia normal","no adnexal masses or tenderness","no cervical motion tenderness","rectovaginal septum normal","uterus normal size, shape, and consistency","vagina normal without discharge"} Extremities: {extremity exam:5109} Neurologic: {neuro exam:17854}   Assessment:   No diagnosis found.   Plan:   There are no diagnoses linked to this encounter.

## 2021-12-21 ENCOUNTER — Other Ambulatory Visit: Payer: Self-pay | Admitting: Obstetrics and Gynecology

## 2021-12-21 ENCOUNTER — Ambulatory Visit (INDEPENDENT_AMBULATORY_CARE_PROVIDER_SITE_OTHER): Payer: 59 | Admitting: Obstetrics and Gynecology

## 2021-12-21 ENCOUNTER — Encounter: Payer: Self-pay | Admitting: Obstetrics and Gynecology

## 2021-12-21 VITALS — BP 112/71 | HR 77 | Resp 16 | Ht 63.0 in | Wt 181.8 lb

## 2021-12-21 DIAGNOSIS — N950 Postmenopausal bleeding: Secondary | ICD-10-CM | POA: Diagnosis not present

## 2021-12-21 DIAGNOSIS — K640 First degree hemorrhoids: Secondary | ICD-10-CM

## 2021-12-21 DIAGNOSIS — Z1231 Encounter for screening mammogram for malignant neoplasm of breast: Secondary | ICD-10-CM

## 2021-12-21 NOTE — Progress Notes (Signed)
    GYNECOLOGY PROGRESS NOTE  Subjective:    Patient ID: Sheila Powers, female    DOB: Aug 06, 1965, 56 y.o.   MRN: [id number]  HPI  Patient is a 56 y.o. [id number] female who presents for evaluation of postmenopausal bleeding. She had one episode of abnormal bleeding that only lasted one day. Bleeding was bright red and mild, no flow. It has been 1 month and she has not had any bleeding since then. She thinks the bleeding was from straining from being constipated. Last seen by Fraser Din, [place] in 2021.    The following portions of the patient's history were reviewed and updated as appropriate:  She  has a past medical history of Anemia, Bell's palsy, Depression, Fibroids, GERD (gastroesophageal reflux disease), History of chicken pox, Uterine polyp, and Vitamin deficiency.  She  has a past surgical history that includes Dilation and curettage of uterus (1990); Colonoscopy with propofol (N/A, 01/14/2015); Breast biopsy (Left, 1987  1996); Endometrial biopsy (10/2008   [facility]); and Colposcopy (01/2015).  Her family history includes Breast cancer (age of onset: 62) in her paternal aunt; Skin cancer in her cousin.  She  reports that she has quit smoking. Her smoking use included cigarettes. She has quit using smokeless tobacco. She reports that she does not drink alcohol and does not use drugs.  Current Outpatient Medications on File Prior to Visit  Medication Sig Dispense Refill   ergocalciferol (VITAMIN D2) 50000 UNITS capsule Take 50,000 Units by mouth once a week.     escitalopram (LEXAPRO) 20 MG tablet Take 20 mg by mouth daily.     omega-3 fish oil (MAXEPA) 1000 MG CAPS capsule Take 2 capsules by mouth daily.     No current facility-administered medications on file prior to visit.   She is allergic to bupropion, erythromycin, and penicillins..  Review of Systems Pertinent items noted in HPI and remainder of comprehensive ROS otherwise negative.   Objective:   Blood pressure 112/71,  pulse 77, resp. rate 16, height '5\' 3"'$  (1.6 m), weight 181 lb 12.8 oz (82.5 kg).  Body mass index is 32.2 kg/m. General appearance: alert and no distress Abdomen: soft, non-tender; bowel sounds normal; no masses,  no organomegaly Pelvic: external genitalia normal, rectovaginal septum normal.  Vagina without discharge mild atrophy present.  Cervix normal appearing, no lesions and no motion tenderness.  Uterus mobile, nontender, normal shape and size.  Adnexae non-palpable, nontender bilaterally.   Rectum: small hemorrhoid present at 7 o'clock, non-tender, non-inflamed, non-incarcerated.  Extremities: extremities normal, atraumatic, no cyanosis or edema Neurologic: Grossly normal   Assessment:   1. Postmenopausal bleeding   2. Breast cancer screening by mammogram   3. Grade I hemorrhoids      Plan:   Discussed etiologies of postmenopausal bleeding (vaginal/endometrial atrophy, presence of hemorrhoids, polyps, or malignancy), concern about precancerous/hyperplasia or cancerous etiology (5 to 10% percent of cases). Uterine bleeding in postmenopausal women is usually light and self-limited. Exclusion of cancer is the main objective; therefore, treatment is usually unnecessary once cancer has been excluded.  Further diagnostic evaluation is indicated for recurrent or persistent bleeding.  Patient notes she is overdue for wellness exam and mammogram. Will schedule for wellness exam, mammogram order placed.  Discussed OTC and home management options for hemorrhoids if bothersome.    Rubie Maid, MD [place]

## 2021-12-22 ENCOUNTER — Ambulatory Visit
Admission: RE | Admit: 2021-12-22 | Discharge: 2021-12-22 | Disposition: A | Discharge status: Home / Self Care | Payer: 59 | Source: Ambulatory Visit | Attending: Obstetrics and Gynecology | Admitting: Obstetrics and Gynecology

## 2021-12-22 DIAGNOSIS — Z1231 Encounter for screening mammogram for malignant neoplasm of breast: Secondary | ICD-10-CM | POA: Insufficient documentation

## 2021-12-23 ENCOUNTER — Other Ambulatory Visit: Payer: Self-pay | Admitting: Obstetrics and Gynecology

## 2021-12-23 DIAGNOSIS — R928 Other abnormal and inconclusive findings on diagnostic imaging of breast: Secondary | ICD-10-CM

## 2021-12-23 DIAGNOSIS — R921 Mammographic calcification found on diagnostic imaging of breast: Secondary | ICD-10-CM

## 2021-12-26 ENCOUNTER — Other Ambulatory Visit: Payer: Self-pay | Admitting: Obstetrics and Gynecology

## 2021-12-26 DIAGNOSIS — R928 Other abnormal and inconclusive findings on diagnostic imaging of breast: Secondary | ICD-10-CM

## 2021-12-27 ENCOUNTER — Ambulatory Visit
Admission: RE | Admit: 2021-12-27 | Discharge: 2021-12-27 | Disposition: A | Discharge status: Home / Self Care | Payer: 59 | Source: Ambulatory Visit | Attending: Obstetrics and Gynecology | Admitting: Obstetrics and Gynecology

## 2021-12-27 DIAGNOSIS — R928 Other abnormal and inconclusive findings on diagnostic imaging of breast: Secondary | ICD-10-CM | POA: Diagnosis present

## 2021-12-27 DIAGNOSIS — R921 Mammographic calcification found on diagnostic imaging of breast: Secondary | ICD-10-CM | POA: Insufficient documentation

## 2021-12-28 ENCOUNTER — Other Ambulatory Visit: Payer: Self-pay | Admitting: Obstetrics and Gynecology

## 2021-12-28 DIAGNOSIS — R921 Mammographic calcification found on diagnostic imaging of breast: Secondary | ICD-10-CM

## 2021-12-28 DIAGNOSIS — R928 Other abnormal and inconclusive findings on diagnostic imaging of breast: Secondary | ICD-10-CM

## 2022-01-18 ENCOUNTER — Ambulatory Visit
Admission: RE | Admit: 2022-01-18 | Discharge: 2022-01-18 | Disposition: A | Discharge status: Home / Self Care | Payer: 59 | Source: Ambulatory Visit | Attending: Obstetrics and Gynecology | Admitting: Obstetrics and Gynecology

## 2022-01-18 DIAGNOSIS — R921 Mammographic calcification found on diagnostic imaging of breast: Secondary | ICD-10-CM | POA: Insufficient documentation

## 2022-01-18 DIAGNOSIS — R928 Other abnormal and inconclusive findings on diagnostic imaging of breast: Secondary | ICD-10-CM

## 2022-01-18 HISTORY — PX: BREAST BIOPSY: SHX20

## 2022-01-18 MED ORDER — LIDOCAINE HCL (PF) 1 % IJ SOLN
20.0000 mL | Freq: Once | INTRAMUSCULAR | Status: AC
  Administered 2022-01-18: 20 mL

## 2022-01-18 MED ORDER — LIDOCAINE-EPINEPHRINE 1 %-1:100000 IJ SOLN
10.0000 mL | Freq: Once | INTRAMUSCULAR | Status: AC
  Administered 2022-01-18: 10 mL

## 2022-01-19 LAB — SURGICAL PATHOLOGY

## 2022-07-25 ENCOUNTER — Ambulatory Visit: Payer: 59 | Admitting: Obstetrics and Gynecology

## 2022-09-14 ENCOUNTER — Ambulatory Visit: Payer: 59 | Admitting: Obstetrics and Gynecology

## 2022-12-18 ENCOUNTER — Telehealth: Payer: Self-pay

## 2022-12-18 DIAGNOSIS — R928 Other abnormal and inconclusive findings on diagnostic imaging of breast: Secondary | ICD-10-CM

## 2022-12-18 NOTE — Telephone Encounter (Signed)
Pt calling; pt needs order for her 4m f/u mammogram; has appt c [facility] Thursday but would like to go ahead and get this scheduled.  [phone]

## 2022-12-18 NOTE — Telephone Encounter (Signed)
Order placed. She can call [facility] to schedule. Thx.

## 2022-12-19 NOTE — Telephone Encounter (Signed)
Called pt, no answer. Left detailed msg order place and she can call to schedule appt.

## 2022-12-28 ENCOUNTER — Encounter: Payer: Self-pay | Admitting: Obstetrics and Gynecology

## 2022-12-28 ENCOUNTER — Other Ambulatory Visit (HOSPITAL_COMMUNITY)
Admission: RE | Admit: 2022-12-28 | Discharge: 2022-12-28 | Disposition: A | Discharge status: Home / Self Care | Payer: 59 | Source: Ambulatory Visit | Attending: Obstetrics and Gynecology | Admitting: Obstetrics and Gynecology

## 2022-12-28 ENCOUNTER — Ambulatory Visit (INDEPENDENT_AMBULATORY_CARE_PROVIDER_SITE_OTHER): Payer: 59 | Admitting: Obstetrics and Gynecology

## 2022-12-28 VITALS — BP 108/71 | HR 71 | Ht 63.0 in | Wt 176.0 lb

## 2022-12-28 DIAGNOSIS — Z1231 Encounter for screening mammogram for malignant neoplasm of breast: Secondary | ICD-10-CM

## 2022-12-28 DIAGNOSIS — Z124 Encounter for screening for malignant neoplasm of cervix: Secondary | ICD-10-CM | POA: Diagnosis present

## 2022-12-28 DIAGNOSIS — Z1151 Encounter for screening for human papillomavirus (HPV): Secondary | ICD-10-CM | POA: Diagnosis present

## 2022-12-28 DIAGNOSIS — Z87898 Personal history of other specified conditions: Secondary | ICD-10-CM

## 2022-12-28 DIAGNOSIS — Z1211 Encounter for screening for malignant neoplasm of colon: Secondary | ICD-10-CM

## 2022-12-28 DIAGNOSIS — Z01419 Encounter for gynecological examination (general) (routine) without abnormal findings: Secondary | ICD-10-CM

## 2022-12-28 NOTE — Patient Instructions (Addendum)
I value your feedback and you entrusting us with your care. If you get a [facility] patient survey, I would appreciate you taking the time to let us know about your experience today. Thank you!  [facility] ([place])--[phone]  

## 2022-12-28 NOTE — Progress Notes (Signed)
Chief Complaint  Patient presents with   Gynecologic Exam    No concerns    HPI:      Sheila Powers is a 57 y.o. No obstetric history on file. who LMP was No LMP recorded. Patient is postmenopausal., presents today for her annual examination.  Her menses are absent due to menopause. She does not have postmenopausal bleeding (had 1 episode 11/23 and saw Dr. Valentino Saxon, no sx since). She has a hx of endometrial polyp rem years ago with Dr. Harold Hedge. She does not have vasomotor sx.   Sex activity: not currently sexually active--widow. No vag sx.   Last Pap: 07/10/19 Results were: no abnormalities /neg HPV DNA.  Hx of STDs: none  Last mammogram: 12/27/21  Results were: cat 4 RT breast with bx 01/18/22 SCLEROTIC INTRADUCTAL PAPILLOMA (PERIPHERAL TYPE), WITH ASSOCIATED CALCIFICATIONS. - USUAL DUCTAL HYPERPLASIA PRESENT. - NO EVIDENCE OF SIGNIFICANT ATYPIA OR MALIGNANCY; repeat RT breast mammo and u/s in 6 months, not done due to health issues with and loss of husband.  There is a FH of breast cancer in her pat aunt. Genetic testing not indicated. There is no FH of ovarian cancer. The patient does occas do self-breast exams.  Colonoscopy: 2019, hx of polyps; due for repeat in 5 yrs. Having hemorrhoid issues. Plans to schedule with [facility], received letter.   Tobacco use: The patient denies current or previous tobacco use. Alcohol use: none  No drug use Exercise: min active  She does get adequate calcium and Vitamin D in her diet.  Labs with PCP.   Past Medical History:  Diagnosis Date   Anemia    Bell's palsy    Depression    Fibroids    GERD (gastroesophageal reflux disease)    History of chicken pox    Uterine polyp    Vitamin deficiency     Past Surgical History:  Procedure Laterality Date   BREAST BIOPSY Left 1987  1996   neg   BREAST BIOPSY Right 01/18/2022   MM RT BREAST BX W LOC DEV 1ST LESION IMAGE BX SPEC STEREO GUIDE 01/18/2022 [facility]   COLONOSCOPY WITH  PROPOFOL N/A 01/14/2015   Procedure: COLONOSCOPY WITH PROPOFOL;  Surgeon: Wallace Cullens, MD;  Location: [facility] ENDOSCOPY;  Service: Gastroenterology;  Laterality: N/A;   COLPOSCOPY  01/2015   Polyd removed   DILATION AND CURETTAGE OF UTERUS  1990   ENDOMETRIAL BIOPSY  10/2008   CAK    Family History  Problem Relation Age of Onset   Breast cancer Paternal Aunt 58       come back currently in 57's   Skin cancer Cousin    Social History   Socioeconomic History   Marital status: Married    Spouse name: Not on file   Number of children: Not on file   Years of education: Not on file   Highest education level: Not on file  Occupational History   Not on file  Tobacco Use   Smoking status: Former    Types: Cigarettes   Smokeless tobacco: Former  Building services engineer status: Never Used  Substance and Sexual Activity   Alcohol use: No   Drug use: No   Sexual activity: Not Currently    Birth control/protection: Post-menopausal  Other Topics Concern   Not on file  Social History Narrative   Not on file   Social Determinants of Health   Financial Resource Strain: Patient Declined (03/20/2022)   Received from  [facility]   Overall Financial Resource Strain (CARDIA)    Difficulty of Paying Living Expenses: Patient declined  Food Insecurity: Patient Declined (03/20/2022)   Received from [facility]   Hunger Vital Sign    Worried About Running Out of Food in the Last Year: Patient declined    Ran Out of Food in the Last Year: Patient declined  Transportation Needs: Patient Declined (03/20/2022)   Received from [facility] - Transportation    In the past 12 months, has lack of transportation kept you from medical appointments or from getting medications?: Patient declined    Lack of Transportation (Non-Medical): Patient declined  Physical Activity: Not on file  Stress: Not on file  Social Connections: Not on file  Intimate  Partner Violence: Not on file    Current Outpatient Medications:    ergocalciferol (VITAMIN D2) 50000 UNITS capsule, Take 50,000 Units by mouth once a week., Disp: , Rfl:    escitalopram (LEXAPRO) 20 MG tablet, Take 20 mg by mouth daily., Disp: , Rfl:    ROS:  Review of Systems  Constitutional:  Negative for fever, malaise/fatigue and weight loss.  HENT:  Negative for congestion, ear pain and sinus pain.   Respiratory:  Negative for cough, shortness of breath and wheezing.   Cardiovascular:  Negative for chest pain, orthopnea and leg swelling.  Gastrointestinal:  Negative for constipation, diarrhea, nausea and vomiting.  Genitourinary:  Negative for dysuria, frequency, hematuria and urgency.       Breast ROS: negative   Musculoskeletal:  Negative for back pain, joint pain and myalgias.  Skin:  Negative for itching and rash.  Neurological:  Negative for dizziness, tingling, focal weakness and headaches.  Endo/Heme/Allergies:  Negative for environmental allergies. Does not bruise/bleed easily.  Psychiatric/Behavioral:  Negative for depression and suicidal ideas. The patient is not nervous/anxious and does not have insomnia.     Objective: BP 108/71   Pulse 71   Ht 5\' 3"  (1.6 m)   Wt 176 lb (79.8 kg)   BMI 31.18 kg/m    Physical Exam Constitutional:      Appearance: She is well-developed.  Genitourinary:     Vulva normal.     Right Labia: No rash, tenderness or lesions.    Left Labia: No tenderness, lesions or rash.    No vaginal discharge, erythema or tenderness.     Mild vaginal atrophy present.     Right Adnexa: not tender and no mass present.    Left Adnexa: not tender and no mass present.    No cervical motion tenderness, friability or polyp.     Uterus is not enlarged or tender.  Breasts:    Right: No mass, nipple discharge, skin change or tenderness.     Left: No mass, nipple discharge, skin change or tenderness.  Neck:     Thyroid: No thyromegaly.   Cardiovascular:     Rate and Rhythm: Normal rate and regular rhythm.     Heart sounds: Normal heart sounds. No murmur heard. Pulmonary:     Effort: Pulmonary effort is normal.     Breath sounds: Normal breath sounds.  Abdominal:     Palpations: Abdomen is soft.     Tenderness: There is no abdominal tenderness. There is no guarding or rebound.  Musculoskeletal:        General: Normal range of motion.     Cervical back: Normal range of motion.  Lymphadenopathy:  Cervical: No cervical adenopathy.  Neurological:     General: No focal deficit present.     Mental Status: She is alert and oriented to person, place, and time.     Cranial Nerves: No cranial nerve deficit.  Skin:    General: Skin is warm and dry.  Psychiatric:        Mood and Affect: Mood normal.        Behavior: Behavior normal.        Thought Content: Thought content normal.        Judgment: Judgment normal.  Vitals reviewed.     Assessment/Plan: Encounter for annual routine gynecological examination  Cervical cancer screening - Plan: Cytology - PAP  Screening for HPV (human papillomavirus) - Plan: Cytology - PAP  Encounter for screening mammogram for malignant neoplasm of breast - Plan: [place] LIMITED ULTRASOUND INCLUDING AXILLA RIGHT BREAST, MM 3D DIAGNOSTIC MAMMOGRAM BILATERAL BREAST; pt to schedule mammo.   History of abnormal mammogram - Plan: [place] LIMITED ULTRASOUND INCLUDING AXILLA RIGHT BREAST, MM 3D DIAGNOSTIC MAMMOGRAM BILATERAL BREAST  Screening for colon cancer--pt to schedule with [facility]. Will call for ref prn.           GYN counsel menopause, adequate intake of calcium and vitamin D     F/U  Return in about 1 year (around 12/28/2023).  Sheila Theissen B. Holbert Caples, PA-C 12/28/2022 4:42 PM

## 2023-01-01 LAB — CYTOLOGY - PAP
Comment: NEGATIVE
Diagnosis: NEGATIVE
High risk HPV: NEGATIVE

## 2023-01-10 ENCOUNTER — Ambulatory Visit
Admission: RE | Admit: 2023-01-10 | Discharge: 2023-01-10 | Disposition: A | Discharge status: Home / Self Care | Payer: 59 | Source: Ambulatory Visit | Attending: Obstetrics and Gynecology | Admitting: Obstetrics and Gynecology

## 2023-01-10 ENCOUNTER — Ambulatory Visit
Admission: RE | Admit: 2023-01-10 | Discharge: 2023-01-10 | Disposition: A | Discharge status: Home / Self Care | Payer: 59 | Source: Ambulatory Visit | Attending: Obstetrics and Gynecology

## 2023-01-10 DIAGNOSIS — R928 Other abnormal and inconclusive findings on diagnostic imaging of breast: Secondary | ICD-10-CM | POA: Insufficient documentation

## 2023-01-13 ENCOUNTER — Encounter: Payer: Self-pay | Admitting: Obstetrics and Gynecology

## 2023-11-26 ENCOUNTER — Ambulatory Visit (INDEPENDENT_AMBULATORY_CARE_PROVIDER_SITE_OTHER): Payer: Self-pay

## 2023-11-26 DIAGNOSIS — Z860101 Personal history of adenomatous and serrated colon polyps: Secondary | ICD-10-CM | POA: Diagnosis not present

## 2023-11-26 DIAGNOSIS — Z09 Encounter for follow-up examination after completed treatment for conditions other than malignant neoplasm: Secondary | ICD-10-CM | POA: Diagnosis present
